# Patient Record
Sex: Female | Born: 2000 | Race: White | Hispanic: Yes | Marital: Single | Smoking: Never smoker
Health system: Southern US, Community
[De-identification: ages and names within clinical notes are randomized; demographics above are authoritative.]

## PROBLEM LIST (undated history)

## (undated) DIAGNOSIS — J45909 Unspecified asthma, uncomplicated: Secondary | ICD-10-CM

---
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
## 2021-11-11 ENCOUNTER — Emergency Department (HOSPITAL_BASED_OUTPATIENT_CLINIC_OR_DEPARTMENT_OTHER): Payer: BLUE CROSS/BLUE SHIELD

## 2021-11-11 ENCOUNTER — Encounter (HOSPITAL_BASED_OUTPATIENT_CLINIC_OR_DEPARTMENT_OTHER): Payer: Self-pay | Admitting: Emergency Medicine

## 2021-11-11 ENCOUNTER — Emergency Department (HOSPITAL_BASED_OUTPATIENT_CLINIC_OR_DEPARTMENT_OTHER)
Admission: EM | Admit: 2021-11-11 | Discharge: 2021-11-11 | Disposition: A | Discharge status: Home / Self Care | Payer: BLUE CROSS/BLUE SHIELD | Attending: Emergency Medicine | Admitting: Emergency Medicine

## 2021-11-11 ENCOUNTER — Other Ambulatory Visit: Payer: Self-pay

## 2021-11-11 DIAGNOSIS — R1031 Right lower quadrant pain: Secondary | ICD-10-CM | POA: Diagnosis present

## 2021-11-11 DIAGNOSIS — J45909 Unspecified asthma, uncomplicated: Secondary | ICD-10-CM | POA: Diagnosis not present

## 2021-11-11 DIAGNOSIS — R197 Diarrhea, unspecified: Secondary | ICD-10-CM | POA: Diagnosis not present

## 2021-11-11 HISTORY — DX: Unspecified asthma, uncomplicated: J45.909

## 2021-11-11 LAB — COMPREHENSIVE METABOLIC PANEL
ALT: 14 U/L (ref 0–44)
AST: 16 U/L (ref 15–41)
Albumin: 3.9 g/dL (ref 3.5–5.0)
Alkaline Phosphatase: 50 U/L (ref 38–126)
Anion gap: 9 (ref 5–15)
BUN: 13 mg/dL (ref 6–20)
CO2: 24 mmol/L (ref 22–32)
Calcium: 9.2 mg/dL (ref 8.9–10.3)
Chloride: 104 mmol/L (ref 98–111)
Creatinine, Ser: 0.62 mg/dL (ref 0.44–1.00)
GFR, Estimated: >60 mL/min (ref >60)
Glucose, Bld: 85 mg/dL (ref 70–99)
Potassium: 4.2 mmol/L (ref 3.5–5.1)
Sodium: 137 mmol/L (ref 135–145)
Total Bilirubin: 0.2 mg/dL — ABNORMAL LOW (ref 0.3–1.2)
Total Protein: 7.1 g/dL (ref 6.5–8.1)

## 2021-11-11 LAB — CBC WITH DIFFERENTIAL/PLATELET
Abs Immature Granulocytes: 0 10*3/uL (ref 0.00–0.07)
Basophils Absolute: 0 10*3/uL (ref 0.0–0.1)
Basophils Relative: 0 %
Eosinophils Absolute: 0.1 10*3/uL (ref 0.0–0.5)
Eosinophils Relative: 3 %
HCT: 45.4 % (ref 36.0–46.0)
Hemoglobin: 14.3 g/dL (ref 12.0–15.0)
Lymphocytes Relative: 31 %
Lymphs Abs: 1.5 10*3/uL (ref 0.7–4.0)
MCH: 30.2 pg (ref 26.0–34.0)
MCHC: 31.5 g/dL (ref 30.0–36.0)
MCV: 95.8 fL (ref 80.0–100.0)
Monocytes Absolute: 0 10*3/uL — ABNORMAL LOW (ref 0.1–1.0)
Monocytes Relative: 1 %
Neutro Abs: 3.1 10*3/uL (ref 1.7–7.7)
Neutrophils Relative %: 65 %
Platelets: 253 10*3/uL (ref 150–400)
RBC: 4.74 MIL/uL (ref 3.87–5.11)
RDW: 12 % (ref 11.5–15.5)
WBC: 4.8 10*3/uL (ref 4.0–10.5)
nRBC: 0 % (ref 0.0–0.2)

## 2021-11-11 LAB — URINALYSIS, ROUTINE W REFLEX MICROSCOPIC
Bilirubin Urine: NEGATIVE
Glucose, UA: NEGATIVE mg/dL
Hgb urine dipstick: NEGATIVE
Ketones, ur: NEGATIVE mg/dL
Leukocytes,Ua: NEGATIVE
Nitrite: NEGATIVE
Specific Gravity, Urine: 1.029 (ref 1.005–1.030)
pH: 5.5 (ref 5.0–8.0)

## 2021-11-11 LAB — PREGNANCY, URINE: Preg Test, Ur: NEGATIVE

## 2021-11-11 LAB — LIPASE, BLOOD: Lipase: 19 U/L (ref 11–51)

## 2021-11-11 MED ORDER — AZITHROMYCIN 500 MG PO TABS: 500.0000 mg | ORAL_TABLET | Freq: Every day | ORAL | 0 refills | Status: AC

## 2021-11-11 MED ORDER — IOHEXOL 300 MG/ML  SOLN
100.0000 mL | Freq: Once | INTRAMUSCULAR | Status: AC | PRN
  Administered 2021-11-11: 80 mL via INTRAVENOUS

## 2021-11-11 MED ORDER — SODIUM CHLORIDE 0.9 % IV BOLUS
1000.0000 mL | Freq: Once | INTRAVENOUS | Status: AC
  Administered 2021-11-11: 1000 mL via INTRAVENOUS

## 2021-11-11 MED ORDER — AZITHROMYCIN 250 MG PO TABS
500.0000 mg | ORAL_TABLET | Freq: Once | ORAL | Status: AC
  Administered 2021-11-11: 500 mg via ORAL
  Filled 2021-11-11: qty 2

## 2021-11-11 MED ORDER — ONDANSETRON 4 MG PO TBDP: 4.0000 mg | ORAL_TABLET | Freq: Three times a day (TID) | ORAL | 0 refills | Status: AC | PRN

## 2021-11-11 NOTE — ED Provider Notes (Signed)
Emergency Department Provider Note   I have reviewed the triage vital signs and the nursing notes.   HISTORY  Chief Complaint Diarrhea and Abdominal Pain   HPI Stephanie Rios is a 21 y.o. female with a past history of asthma presents emergency department with multiple episodes of watery diarrhea after returning from [place] earlier this week.  Symptoms been ongoing now for 4 to 5 days.  She did also test positive for COVID on a home test but is not having respiratory symptoms.  She denies any blood in the diarrhea.  She is having some abdominal discomfort, worse in the right lower quadrant.  She was seen at urgent care yesterday and they advised further evaluation but patient continue to monitor symptoms at home.  She continues to feel discomfort throughout her abdomen although is worse in the right lower quadrant.  She continues to have diarrhea.  No vomiting.  She is drinking fluids.    Past Medical History:  Diagnosis Date   Asthma     Review of Systems  Constitutional: No fever/chills. Positive fatigue.  Eyes: No visual changes. ENT: No sore throat. Cardiovascular: Denies chest pain. Respiratory: Denies shortness of breath. Gastrointestinal: Positive RLQ abdominal pain.  No nausea, no vomiting.  Positive diarrhea.  No constipation. Genitourinary: Negative for dysuria. Musculoskeletal: Negative for back pain. Skin: Negative for rash. Neurological: Negative for headaches, focal weakness or numbness.   ____________________________________________   PHYSICAL EXAM:  VITAL SIGNS: ED Triage Vitals  Enc Vitals Group     BP 11/11/21 1632 96/68     Pulse Rate 11/11/21 1632 67     Resp 11/11/21 1632 16     Temp 11/11/21 1632 98 F (36.7 C)     Temp Source 11/11/21 1632 Oral     SpO2 11/11/21 1632 99 %   Constitutional: Alert and oriented. Well appearing and in no acute distress. Eyes: Conjunctivae are normal.  Head: Atraumatic. Nose: No  congestion/rhinnorhea. Mouth/Throat: Mucous membranes are moist. Neck: No stridor.  Cardiovascular: Normal rate, regular rhythm. Good peripheral circulation. Grossly normal heart sounds.   Respiratory: Normal respiratory effort.  No retractions. Lungs CTAB. Gastrointestinal: Soft with mild RLQ tenderness. No rebound or guarding. No distention.  Musculoskeletal: No lower extremity tenderness nor edema. No gross deformities of extremities. Neurologic:  Normal speech and language. No gross focal neurologic deficits are appreciated.  Skin:  Skin is warm, dry and intact. No rash noted.   ____________________________________________   LABS (all labs ordered are listed, but only abnormal results are displayed)  Labs Reviewed  GASTROINTESTINAL PANEL BY PCR, STOOL (REPLACES STOOL CULTURE) - Abnormal; Notable for the following components:      Result Value   Enteroaggregative E coli (EAEC) DETECTED (*)    Enterotoxigenic E coli (ETEC) DETECTED (*)    Shigella/Enteroinvasive E coli (EIEC) DETECTED (*)    All other components within normal limits  COMPREHENSIVE METABOLIC PANEL - Abnormal; Notable for the following components:   Total Bilirubin 0.2 (*)    All other components within normal limits  CBC WITH DIFFERENTIAL/PLATELET - Abnormal; Notable for the following components:   Monocytes Absolute 0.0 (*)    All other components within normal limits  URINALYSIS, ROUTINE W REFLEX MICROSCOPIC - Abnormal; Notable for the following components:   Protein, ur TRACE (*)    All other components within normal limits  LIPASE, BLOOD  PREGNANCY, URINE   ____________________________________________   PROCEDURES  Procedure(s) performed:   Procedures  None ____________________________________________  INITIAL IMPRESSION / ASSESSMENT AND PLAN / ED COURSE  Pertinent labs & imaging results that were available during my care of the patient were reviewed by me and considered in my medical decision  making (see chart for details).   This patient is Presenting for Evaluation of abdominal pain, which does require a range of treatment options, and is a complaint that involves a high risk of morbidity and mortality.  The Differential Diagnoses includes but is not exclusive to acute cholecystitis, intrathoracic causes for epigastric abdominal pain, gastritis, duodenitis, pancreatitis, small bowel or large bowel obstruction, abdominal aortic aneurysm, hernia, gastritis, etc.   Critical Interventions-    Medications  sodium chloride 0.9 % bolus 1,000 mL (0 mLs Intravenous Stopped 11/11/21 1915)  iohexol (OMNIPAQUE) 300 MG/ML solution 100 mL (80 mLs Intravenous Contrast Given 11/11/21 1836)  azithromycin (ZITHROMAX) tablet 500 mg (500 mg Oral Given 11/11/21 2021)    Reassessment after intervention: patient feeling improved.    I did obtain Additional Historical Information from Mom at bedside.   Clinical Laboratory Tests Ordered, included pregnancy negative. No UTI. No leukocytosis. No AKI or electrolyte disturbance.   Radiologic Tests Ordered, included CT abdomen/pelvis. I independently interpreted the images and agree with radiology interpretation.   Cardiac Monitor Tracing which shows NSR.   Social Determinants of Health Risk patient is a non-smoker.   Medical Decision Making: Summary:  Patient presents emergency department with abdominal pain and diarrhea after returning from [place].  She is also diagnosed with COVID although is not having any respiratory symptoms.  Oxygen saturation is 99% on room air and she has no increased work of breathing or other respiratory symptoms.  She is not high risk for developing severe COVID symptoms and was not placed on antiviral medication after she was seen for COVID earlier this week.  COVID could be contributing to her symptoms although do suspect potential bacterial GI infection with recent travel.  She does have some tenderness in the abdomen.   Plan for CT imaging along with labs and reassess.  Reevaluation with update and discussion with patient and Mom at bedside. Symptoms improved. Plan to empirically start Azithromycin and patient will follow stool panel in the MyChart app.   Considered admission but symptoms improved and stool panel sent.   Disposition: discharge  ____________________________________________  FINAL CLINICAL IMPRESSION(S) / ED DIAGNOSES  Final diagnoses:  Right lower quadrant abdominal pain  Diarrhea of presumed infectious origin     NEW OUTPATIENT MEDICATIONS STARTED DURING THIS VISIT:  Discharge Medication List as of 11/11/2021  8:20 PM     START taking these medications   Details  azithromycin (ZITHROMAX) 500 MG tablet Take 1 tablet (500 mg total) by mouth daily for 4 days. Take first 2 tablets together, then 1 every day until finished., Starting Sun 11/12/2021, Until Thu 11/16/2021, Normal    ondansetron (ZOFRAN-ODT) 4 MG disintegrating tablet Take 1 tablet (4 mg total) by mouth every 8 (eight) hours as needed., Starting Sat 11/11/2021, Normal        Note:  This document was prepared using Dragon voice recognition software and may include unintentional dictation errors.  Alona Bene, MD, [facility] Emergency Medicine    Dravon Nott, Arlyss Repress, MD 11/15/21 [phone]

## 2021-11-12 ENCOUNTER — Telehealth (HOSPITAL_BASED_OUTPATIENT_CLINIC_OR_DEPARTMENT_OTHER): Payer: Self-pay | Admitting: *Deleted

## 2021-11-12 LAB — GASTROINTESTINAL PANEL BY PCR, STOOL (REPLACES STOOL CULTURE)
Adenovirus F40/41: NOT DETECTED
Astrovirus: NOT DETECTED
Campylobacter species: NOT DETECTED
Cryptosporidium: NOT DETECTED
Cyclospora cayetanensis: NOT DETECTED
Entamoeba histolytica: NOT DETECTED
Enteroaggregative E coli (EAEC): DETECTED — AB
Enteropathogenic E coli (EPEC): NOT DETECTED
Enterotoxigenic E coli (ETEC): DETECTED — AB
Giardia lamblia: NOT DETECTED
Norovirus GI/GII: NOT DETECTED
Plesimonas shigelloides: NOT DETECTED
Rotavirus A: NOT DETECTED
Salmonella species: NOT DETECTED
Sapovirus (I, II, IV, and V): NOT DETECTED
Shiga like toxin producing E coli (STEC): NOT DETECTED
Shigella/Enteroinvasive E coli (EIEC): DETECTED — AB
Vibrio cholerae: NOT DETECTED
Vibrio species: NOT DETECTED
Yersinia enterocolitica: NOT DETECTED
